# Patient Record
Sex: Female | Born: 1955 | Race: White | Hispanic: No | Marital: Married | Smoking: Current every day smoker
Health system: Southern US, Community
[De-identification: ages and names within clinical notes are randomized; demographics above are authoritative.]

---

## 2007-11-20 ENCOUNTER — Ambulatory Visit: Payer: Self-pay | Admitting: Internal Medicine

## 2009-04-10 IMAGING — CR DG RIBS 2V*R*
1 series · 4 of 4 positions shown · non-contrast
Comparison: none

REASON FOR EXAM: pain inferior and anterior
COMMENTS:

PROCEDURE:     MDR - MDR RIBS RIGHT UNLILATERAL  - November 20, 2007  [DATE]
RESULT:     No fracture or other acute bony abnormality is identified.

[Series 1: view not recorded · 0.17mm/px · 4 of 4 slices shown]
[im 1/4]
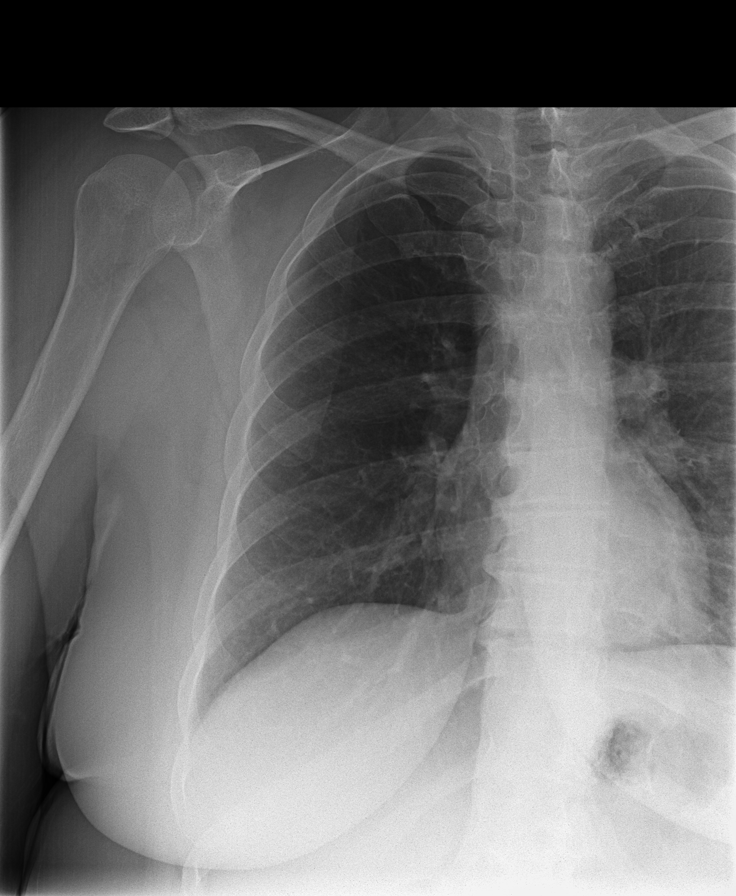
[im 2/4]
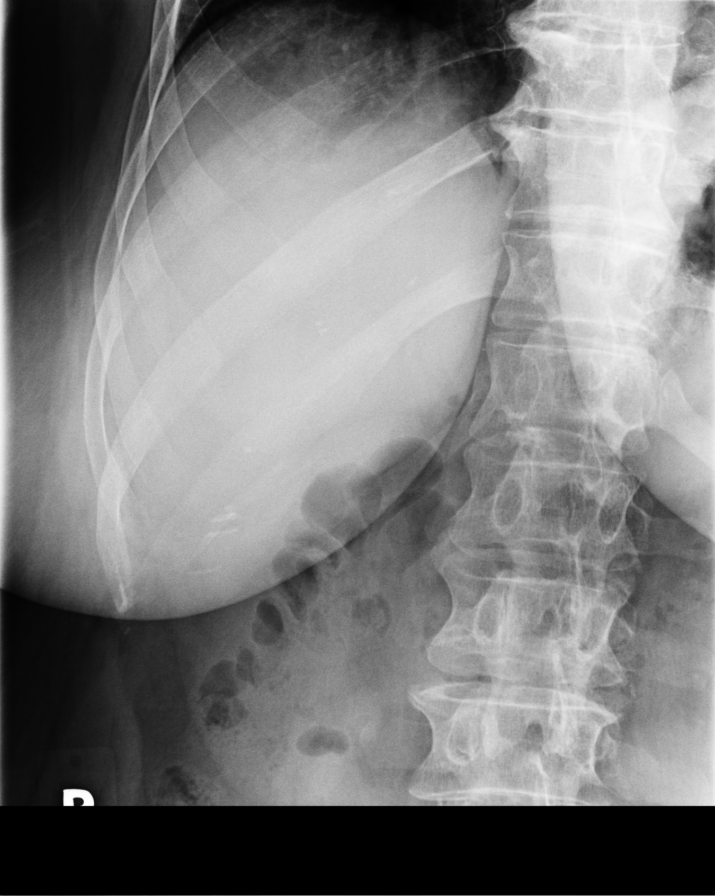
[im 3/4]
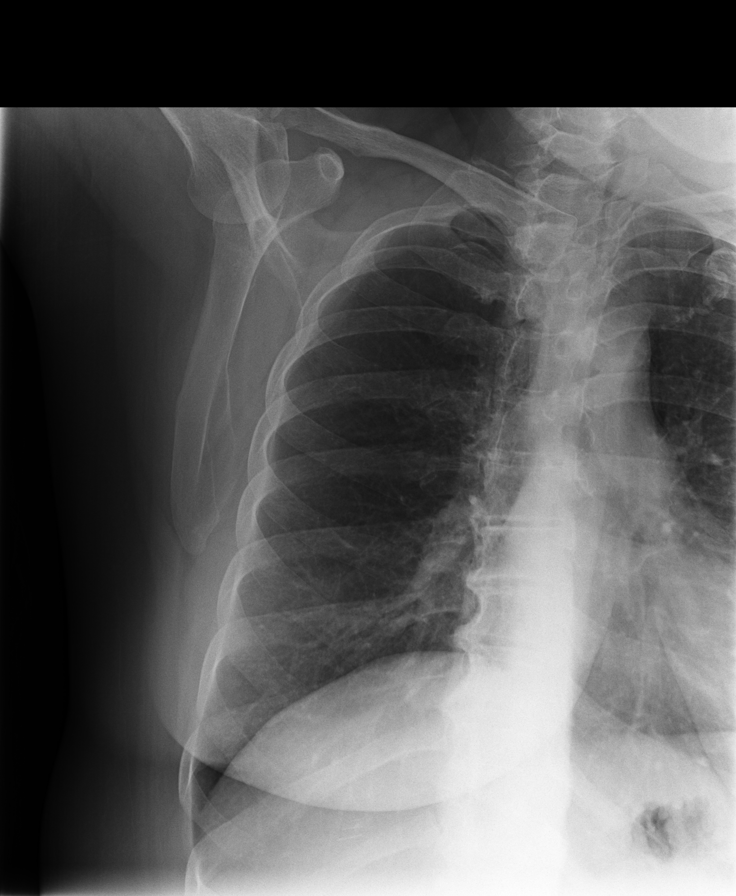
[im 4/4]
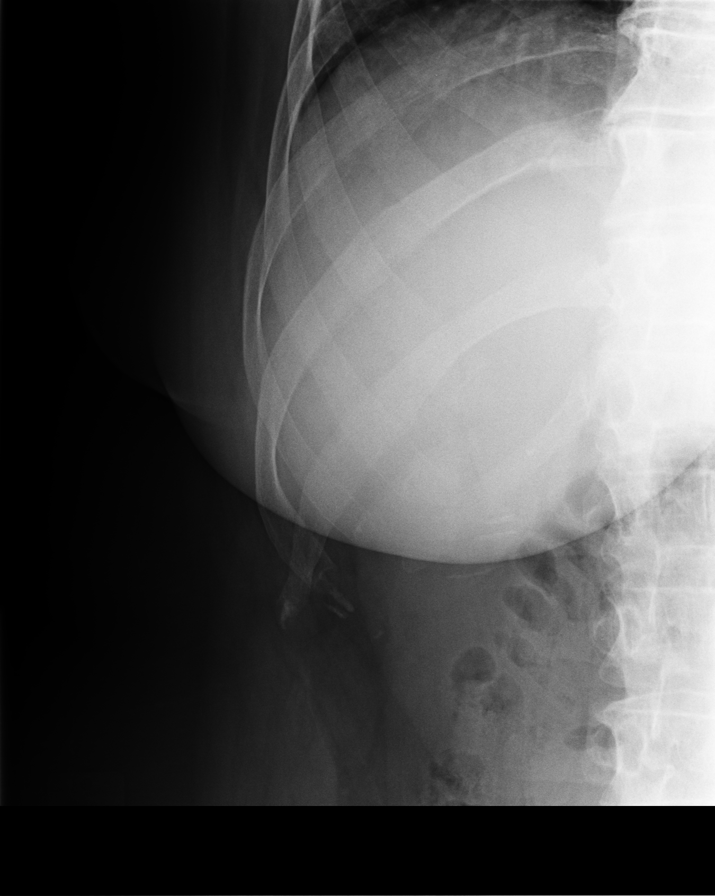

[4 of 4 positions shown; findings below may reference images not displayed]

IMPRESSION: No significant osseous abnormalities are noted.

## 2016-02-25 DIAGNOSIS — J449 Chronic obstructive pulmonary disease, unspecified: Secondary | ICD-10-CM | POA: Insufficient documentation

## 2016-02-25 DIAGNOSIS — K219 Gastro-esophageal reflux disease without esophagitis: Secondary | ICD-10-CM | POA: Insufficient documentation

## 2016-02-28 DIAGNOSIS — K265 Chronic or unspecified duodenal ulcer with perforation: Secondary | ICD-10-CM | POA: Insufficient documentation

## 2022-03-19 ENCOUNTER — Ambulatory Visit
Admission: EM | Admit: 2022-03-19 | Discharge: 2022-03-19 | Disposition: A | Discharge status: Home / Self Care | Payer: Self-pay

## 2022-03-19 DIAGNOSIS — J441 Chronic obstructive pulmonary disease with (acute) exacerbation: Secondary | ICD-10-CM

## 2022-03-19 DIAGNOSIS — Z8719 Personal history of other diseases of the digestive system: Secondary | ICD-10-CM | POA: Insufficient documentation

## 2022-03-19 DIAGNOSIS — Z720 Tobacco use: Secondary | ICD-10-CM | POA: Insufficient documentation

## 2022-03-19 DIAGNOSIS — F32A Depression, unspecified: Secondary | ICD-10-CM | POA: Insufficient documentation

## 2022-03-19 MED ORDER — AMOXICILLIN-POT CLAVULANATE 875-125 MG PO TABS: 1.0000 | ORAL_TABLET | Freq: Two times a day (BID) | ORAL | 0 refills | Status: AC

## 2022-03-19 NOTE — ED Triage Notes (Signed)
Patient is here for "Cough, pain in upper back due to cough" COPD Exacerbation. No fever. Currently on steroids. On thrush medication. This episode started "about a week ago". "But on/off sick for over a month".   Note: Daughter is on respirator in hospital. Very anxious.

## 2022-03-19 NOTE — Discharge Instructions (Addendum)
-  Start the antibiotic-Augmentin (amoxicillin-clavulanate), 1 pill every 12 hours for 7 days.  You can take this with food like with breakfast and dinner. -Complete prednisone as directed -Albuterol inhaler or nebulizer as needed for cough, wheezing, shortness of breath, 1 to 2 puffs every 6 hours as needed.

## 2022-03-19 NOTE — ED Notes (Signed)
No UA Needed. Order to D/C'd.

## 2022-03-19 NOTE — ED Provider Notes (Signed)
[facility]    CSN: [id number] Arrival date & time: 03/19/22  1749      History   Chief Complaint Chief Complaint  Patient presents with   Back Pain   COPD Exacerbation    HPI Heather Arroyo is a 66 y.o. female presenting with cough and back pain with coughing.  History of COPD, tobacco abuse.  Vague history of exposure to COVID 1 week ago, however she actually had COVID 1 month ago and states that she is unsure if she had COVID last week.  She states that she has had a cough productive of yellow sputum for the last week, associated with some dyspnea on exertion and during coughing spells.  The left-sided back pain is only with coughing and movement, there is no pain at rest, and no radiation of the pain.  Denies abdominal pain, nausea, vomiting, diarrhea, constipation.  Denies urinary symptoms.  States she is using her albuterol inhaler every 4 hours with temporary improvement.  She does not have other inhalers that she uses.  She was also prescribed a prednisone taper by PCP, she has almost completed this as directed.  Has not attempted other medications for the symptoms.  She is currently stressed due to her daughter who is sick.  HPI  History reviewed. No pertinent past medical history.  Patient Active Problem List   Diagnosis Date Noted   Tobacco user 03/19/2022   Morbid obesity (HCC) 03/19/2022   History of duodenal ulcer 03/19/2022   Depressive disorder 03/19/2022   Duodenal ulcer with perforation (HCC) 02/28/2016   Morbid obesity with BMI of 40.0-44.9, adult (HCC) 02/26/2016   GERD (gastroesophageal reflux disease) 02/25/2016   COPD (chronic obstructive pulmonary disease) (HCC) 02/25/2016    History reviewed. No pertinent surgical history.  OB History   No obstetric history on file.      Home Medications    Prior to Admission medications   Medication Sig Start Date End Date Taking? Authorizing Provider  albuterol (PROVENTIL) (2.5 MG/3ML) 0.083%  nebulizer solution Take 2.5 mg by nebulization every 4 (four) hours as needed. Last used: 0630 am today. 03/08/22  Yes [provider]  albuterol (VENTOLIN HFA) 108 (90 Base) MCG/ACT inhaler 2 puffs every 4 (four) hours as needed for wheezing or shortness of breath. Last used: 0430 am. 12/15/10  Yes [provider]  amoxicillin-clavulanate (AUGMENTIN) 875-125 MG tablet Take 1 tablet by mouth every 12 (twelve) hours. 03/19/22  Yes Rhys Martini, PA-C  budesonide (RHINOCORT AQUA) 32 MCG/ACT nasal spray Rhinocort Allergy 32 mcg/actuation nasal spray  Take 1 spray every day by nasal route as directed for 30 days.   Yes [provider]  escitalopram (LEXAPRO) 20 MG tablet Take by mouth.   Yes [provider]  ipratropium-albuterol (DUONEB) 0.5-2.5 (3) MG/3ML SOLN Take 3 mLs by nebulization every 4 (four) hours as needed. Last used this am.   Yes [provider]  methylPREDNISolone (MEDROL DOSEPAK) 4 MG TBPK tablet FOLLOW PACKAGE INSTRUCTIONS 03/16/22  Yes [provider]  nystatin (MYCOSTATIN) 100000 UNIT/ML suspension SMARTSIG:1 teaspoon By Mouth 4 Times Daily 03/16/22  Yes [provider]  omeprazole (PRILOSEC) 20 MG capsule Take by mouth.   Yes [provider]  ondansetron (ZOFRAN) 4 MG tablet Take 4 mg by mouth 3 (three) times daily as needed. 02/01/22  Yes [provider]  oxyCODONE (OXY IR/ROXICODONE) 5 MG immediate release tablet Take by mouth. 03/07/16  Yes [provider]  predniSONE (DELTASONE) 5  MG tablet Take by mouth. 02/01/22   [provider]    Family History No family history on file.  Social History Social History   Tobacco Use   Smoking status: Every Day    Types: Cigarettes   Smokeless tobacco: Never  Vaping Use   Vaping Use: Never used  Substance Use Topics   Alcohol use: Not Currently   Drug use: Never     Allergies   Ciprofloxacin, Codeine, Dexlansoprazole, and Sulfa  antibiotics   Review of Systems Review of Systems  Constitutional:  Negative for appetite change, chills and fever.  HENT:  Negative for congestion, ear pain, rhinorrhea, sinus pressure, sinus pain and sore throat.   Eyes:  Negative for redness and visual disturbance.  Respiratory:  Positive for cough. Negative for chest tightness, shortness of breath and wheezing.   Cardiovascular:  Negative for chest pain and palpitations.  Gastrointestinal:  Negative for abdominal pain, constipation, diarrhea, nausea and vomiting.  Genitourinary:  Negative for dysuria, frequency and urgency.  Musculoskeletal:  Negative for myalgias.  Neurological:  Negative for dizziness, weakness and headaches.  Psychiatric/Behavioral:  Negative for confusion.   All other systems reviewed and are negative.   Physical Exam Triage Vital Signs ED Triage Vitals  Enc Vitals Group     BP 03/19/22 0849 128/73     Pulse Rate 03/19/22 0849 75     Resp 03/19/22 0849 (!) 22     Temp 03/19/22 0849 98.1 F (36.7 C)     Temp Source 03/19/22 0849 Oral     SpO2 03/19/22 0849 99 %     Weight --      Height --      Head Circumference --      Peak Flow --      Pain Score 03/19/22 0842 5     Pain Loc --      Pain Edu? --      Excl. in GC? --    No data found.  Updated Vital Signs BP 128/73 (BP Location: Left Arm)   Pulse 75   Temp 98.1 F (36.7 C) (Oral)   Resp (!) 22   LMP  (LMP Unknown)   SpO2 99%   Visual Acuity Right Eye Distance:   Left Eye Distance:   Bilateral Distance:    Right Eye Near:   Left Eye Near:    Bilateral Near:     Physical Exam Vitals reviewed.  Constitutional:      General: She is not in acute distress.    Appearance: Normal appearance. She is not ill-appearing.  HENT:     Head: Normocephalic and atraumatic.     Right Ear: Tympanic membrane, ear canal and external ear normal. No tenderness. No middle ear effusion. There is no impacted cerumen. Tympanic membrane is not perforated,  erythematous, retracted or bulging.     Left Ear: Tympanic membrane, ear canal and external ear normal. No tenderness.  No middle ear effusion. There is no impacted cerumen. Tympanic membrane is not perforated, erythematous, retracted or bulging.     Nose: Nose normal. No congestion.     Mouth/Throat:     Mouth: Mucous membranes are moist.     Pharynx: Uvula midline. No oropharyngeal exudate or posterior oropharyngeal erythema.  Eyes:     Extraocular Movements: Extraocular movements intact.     Pupils: Pupils are equal, round, and reactive to light.  Cardiovascular:     Rate and Rhythm: Normal rate and regular rhythm.  Heart sounds: Normal heart sounds.  Pulmonary:     Effort: Pulmonary effort is normal.     Breath sounds: Rhonchi present. No decreased breath sounds, wheezing or rales.     Comments: Freq hacking cough  Rhonchi throughout L>R Abdominal:     Palpations: Abdomen is soft.     Tenderness: There is no abdominal tenderness. There is no guarding or rebound.  Musculoskeletal:     Comments: Left-sided thoracic pain elicited with twisting side to side.  Lymphadenopathy:     Cervical: No cervical adenopathy.     Right cervical: No superficial cervical adenopathy.    Left cervical: No superficial cervical adenopathy.  Neurological:     General: No focal deficit present.     Mental Status: She is alert and oriented to person, place, and time.  Psychiatric:        Mood and Affect: Mood normal.        Behavior: Behavior normal.        Thought Content: Thought content normal.        Judgment: Judgment normal.     UC Treatments / Results  Labs (all labs ordered are listed, but only abnormal results are displayed) Labs Reviewed  URINALYSIS, ROUTINE W REFLEX MICROSCOPIC    EKG   Radiology No results found.  Procedures Procedures (including critical care time)  Medications Ordered in UC Medications - No data to display  Initial Impression / Assessment and Plan /  UC Course  I have reviewed the triage vital signs and the nursing notes.  Pertinent labs & imaging results that were available during my care of the patient were reviewed by me and considered in my medical decision making (see chart for details).     This patient is a very pleasant 66 y.o. year old female presenting with COPD exacerbation x1 week. Afebrile, nontachy.  She does have rhonchi throughout left greater than right.  Breath sounds are full in all lung fields, low concern for pneumothorax.  She has already completed prednisone taper, and is using albuterol as directed.  At this point, given cough productive of purulent sputum, will manage with Augmentin to cover for COPD exacerbation and possible pneumonia.  She is in agreement.  Continue albuterol inhaler. ED return precautions discussed. Patient verbalizes understanding and agreement. -Coding Level 4 for acute exacerbation of chronic condition and prescription drug management. Did not check a CXR as this would not change management. .   Final Clinical Impressions(s) / UC Diagnoses   Final diagnoses:  COPD exacerbation (HCC)     Discharge Instructions      -Start the antibiotic-Augmentin (amoxicillin-clavulanate), 1 pill every 12 hours for 7 days.  You can take this with food like with breakfast and dinner. -Complete prednisone as directed -Albuterol inhaler or nebulizer as needed for cough, wheezing, shortness of breath, 1 to 2 puffs every 6 hours as needed.    ED Prescriptions     Medication Sig Dispense Auth. Provider   amoxicillin-clavulanate (AUGMENTIN) 875-125 MG tablet Take 1 tablet by mouth every 12 (twelve) hours. 14 tablet Rhys MartiniGraham, Teresha Hanks E, PA-C      PDMP not reviewed this encounter.   Rhys MartiniGraham, Zyon Grout E, PA-C 03/19/22 1000
